# Patient Record
Sex: Female | Born: 2005 | Race: Black or African American | Hispanic: No | Marital: Single | Smoking: Never smoker
Health system: Southern US, Community
[De-identification: ages and names within clinical notes are randomized; demographics above are authoritative.]

## PROBLEM LIST (undated history)

## (undated) DIAGNOSIS — J302 Other seasonal allergic rhinitis: Secondary | ICD-10-CM

## (undated) HISTORY — DX: Other seasonal allergic rhinitis: J30.2

## (undated) HISTORY — PX: NO PAST SURGERIES: SHX2092

---
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
## 2023-11-14 ENCOUNTER — Encounter: Payer: Managed Care, Other (non HMO) | Admitting: Physician Assistant

## 2023-12-16 ENCOUNTER — Encounter: Payer: Self-pay | Admitting: Physician Assistant

## 2023-12-16 ENCOUNTER — Ambulatory Visit (INDEPENDENT_AMBULATORY_CARE_PROVIDER_SITE_OTHER): Payer: Self-pay | Admitting: Physician Assistant

## 2023-12-16 VITALS — BP 119/80 | HR 94 | Temp 98.2°F | Resp 16 | Ht <= 58 in | Wt 172.0 lb

## 2023-12-16 DIAGNOSIS — Z0001 Encounter for general adult medical examination with abnormal findings: Secondary | ICD-10-CM

## 2023-12-16 NOTE — Progress Notes (Signed)
 [facility] [address] 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   No facility-administered encounter medications on file as of 12/16/2023.    Surgical History: Past Surgical History:  Procedure Laterality Date   NO PAST SURGERIES      Medical History: Past Medical History:  Diagnosis Date   Seasonal allergies     Family History: Family History  Problem Relation Age of Onset   Obesity Mother    Diabetes Mother    Early puberty Mother        menarche at age 71   Thyroid disease Mother        transient postpartum hypothyroid   Diabetes Paternal Grandmother    Hypertension Paternal Grandfather    Early puberty Father        done growing in 9th grade      Review of Systems  Constitutional:  Negative for chills, fatigue and unexpected weight change.  HENT:  Negative for congestion, postnasal drip, rhinorrhea, sneezing and sore throat.   Eyes:  Negative for redness.  Respiratory:  Negative for cough, chest tightness and shortness of breath.   Cardiovascular:  Negative for chest pain and palpitations.  Gastrointestinal:  Negative for abdominal pain,  constipation, diarrhea, nausea and vomiting.  Genitourinary:  Negative for dysuria and frequency.  Musculoskeletal:  Negative for arthralgias, back pain, joint swelling and neck pain.  Skin:  Negative for rash.  Neurological: Negative.  Negative for tremors and numbness.  Hematological:  Negative for adenopathy. Does not bruise/bleed easily.  Psychiatric/Behavioral:  Negative for behavioral problems (Depression), sleep disturbance and suicidal ideas. The patient is not nervous/anxious.      Vital Signs: BP 119/80   Pulse 94   Temp 98.2 F (36.8 C)   Resp 16   Ht 4' 10 (1.473 m)   Wt 172 lb (78 kg)   SpO2 98%   BMI 35.95 kg/m    Physical Exam Constitutional:      General: She is not in acute distress.    Appearance: She is well-developed. She is not diaphoretic.  HENT:     Head: Normocephalic and atraumatic.     Mouth/Throat:     Pharynx: No oropharyngeal exudate.  Eyes:     Pupils: Pupils are equal, round, and reactive to light.  Neck:     Thyroid: No thyromegaly.     Vascular: No JVD.     Trachea: No tracheal deviation.  Cardiovascular:     Rate and Rhythm: Normal rate and regular rhythm.     Heart sounds: Normal heart sounds. No murmur heard.    No friction rub. No gallop.  Pulmonary:  Effort: Pulmonary effort is normal. No respiratory distress.     Breath sounds: No wheezing or rales.  Chest:     Chest wall: No tenderness.  Breasts:    Right: Normal. No mass.     Left: Normal. No mass.  Abdominal:     General: Bowel sounds are normal.     Palpations: Abdomen is soft.     Tenderness: There is no abdominal tenderness.  Musculoskeletal:        General: Normal range of motion.     Cervical back: Normal range of motion and neck supple.  Lymphadenopathy:     Cervical: No cervical adenopathy.  Skin:    General: Skin is warm and dry.  Neurological:     Mental Status: She is alert and oriented to person, place, and time.     Cranial Nerves: No cranial nerve  deficit.  Psychiatric:        Behavior: Behavior normal.        Thought Content: Thought content normal.        Judgment: Judgment normal.      LABS: No results found for this or any previous visit (from the past 2160 hours).      Assessment/Plan: 1. Encounter for general adult medical examination with abnormal findings (Primary) CPE performed, lab slip given   General Counseling: Nyna verbalizes understanding of the findings of todays visit and agrees with plan of treatment. I have discussed any further diagnostic evaluation that may be needed or ordered today. We also reviewed her medications today. she has been encouraged to call the office with any questions or concerns that should arise related to todays visit.    Counseling:    No orders of the defined types were placed in this encounter.   No orders of the defined types were placed in this encounter.   This patient was seen by Tinnie Pro, PA-C in collaboration with Dr. Sigrid Bathe as a part of collaborative care agreement.  Total time spent:30 Minutes  Time spent includes review of chart, medications, test results, and follow up plan with the patient.     Sigrid CHRISTELLA Bathe, MD  Internal Medicine

## 2024-04-20 ENCOUNTER — Other Ambulatory Visit: Payer: Self-pay | Admitting: Physician Assistant

## 2024-04-21 LAB — COMPREHENSIVE METABOLIC PANEL WITH GFR
ALT: 22 IU/L (ref 0–32)
AST: 23 IU/L (ref 0–40)
Albumin: 4.3 g/dL (ref 4.0–5.0)
Alkaline Phosphatase: 117 IU/L — ABNORMAL HIGH (ref 42–106)
BUN/Creatinine Ratio: 14 (ref 9–23)
BUN: 13 mg/dL (ref 6–20)
Bilirubin Total: 0.3 mg/dL (ref 0.0–1.2)
CO2: 22 mmol/L (ref 20–29)
Calcium: 9.5 mg/dL (ref 8.7–10.2)
Chloride: 101 mmol/L (ref 96–106)
Creatinine, Ser: 0.96 mg/dL (ref 0.57–1.00)
Globulin, Total: 2.5 g/dL (ref 1.5–4.5)
Glucose: 79 mg/dL (ref 70–99)
Potassium: 4.5 mmol/L (ref 3.5–5.2)
Sodium: 138 mmol/L (ref 134–144)
Total Protein: 6.8 g/dL (ref 6.0–8.5)
eGFR: 87 mL/min/1.73 (ref >59)

## 2024-04-21 LAB — CBC WITH DIFFERENTIAL/PLATELET
Basophils Absolute: 0 x10E3/uL (ref 0.0–0.2)
Basos: 1 %
EOS (ABSOLUTE): 0.1 x10E3/uL (ref 0.0–0.4)
Eos: 2 %
Hematocrit: 42.9 % (ref 34.0–46.6)
Hemoglobin: 13.9 g/dL (ref 11.1–15.9)
Immature Grans (Abs): 0 x10E3/uL (ref 0.0–0.1)
Immature Granulocytes: 0 %
Lymphocytes Absolute: 1.9 x10E3/uL (ref 0.7–3.1)
Lymphs: 39 %
MCH: 29 pg (ref 26.6–33.0)
MCHC: 32.4 g/dL (ref 31.5–35.7)
MCV: 90 fL (ref 79–97)
Monocytes Absolute: 0.5 x10E3/uL (ref 0.1–0.9)
Monocytes: 10 %
Neutrophils Absolute: 2.4 x10E3/uL (ref 1.4–7.0)
Neutrophils: 48 %
Platelets: 339 x10E3/uL (ref 150–450)
RBC: 4.79 x10E6/uL (ref 3.77–5.28)
RDW: 12.7 % (ref 11.7–15.4)
WBC: 4.9 x10E3/uL (ref 3.4–10.8)

## 2024-04-21 LAB — LIPID PANEL WITH LDL/HDL RATIO
Cholesterol, Total: 165 mg/dL (ref 100–199)
HDL: 49 mg/dL (ref >39)
LDL Chol Calc (NIH): 100 mg/dL — ABNORMAL HIGH (ref 0–99)
LDL/HDL Ratio: 2 ratio (ref 0.0–3.2)
Triglycerides: 88 mg/dL (ref 0–149)
VLDL Cholesterol Cal: 16 mg/dL (ref 5–40)

## 2024-04-21 LAB — IRON AND TIBC
Iron Saturation: 28 % (ref 15–55)
Iron: 73 ug/dL (ref 27–159)
Total Iron Binding Capacity: 263 ug/dL (ref 250–450)
UIBC: 190 ug/dL (ref 131–425)

## 2024-04-21 LAB — VITAMIN D 25 HYDROXY (VIT D DEFICIENCY, FRACTURES): Vit D, 25-Hydroxy: 39.4 ng/mL (ref 30.0–100.0)

## 2024-04-21 LAB — T4, FREE: Free T4: 1.08 ng/dL (ref 0.82–1.77)

## 2024-04-21 LAB — TSH: TSH: 1.75 u[IU]/mL (ref 0.450–4.500)

## 2024-04-21 LAB — FERRITIN: Ferritin: 120 ng/mL (ref 15–150)

## 2024-04-24 ENCOUNTER — Ambulatory Visit: Payer: Self-pay | Admitting: Physician Assistant

## 2024-04-27 NOTE — Telephone Encounter (Signed)
 Spoke with patient regarding lab results.

## 2024-04-27 NOTE — Telephone Encounter (Signed)
-----   Message from Sandy Dunlap Pro sent at 04/24/2024  6:14 PM EST ----- Please let her know that her labs look ok, cholesterol improving

## 2024-12-17 ENCOUNTER — Encounter: Payer: Self-pay | Admitting: Physician Assistant
